# Patient Record
Sex: Female | Born: 1990 | Hispanic: No | Marital: Single | Smoking: Current every day smoker
Health system: Southern US, Community
[De-identification: ages and names within clinical notes are randomized; demographics above are authoritative.]

## PROBLEM LIST (undated history)

## (undated) DIAGNOSIS — F1120 Opioid dependence, uncomplicated: Secondary | ICD-10-CM

## (undated) DIAGNOSIS — J45909 Unspecified asthma, uncomplicated: Secondary | ICD-10-CM

---

## 2017-03-16 ENCOUNTER — Emergency Department (HOSPITAL_COMMUNITY): Payer: Self-pay

## 2017-03-16 ENCOUNTER — Emergency Department (HOSPITAL_COMMUNITY)
Admission: EM | Admit: 2017-03-16 | Discharge: 2017-03-16 | Disposition: A | Discharge status: Home / Self Care | Payer: Self-pay | Attending: Emergency Medicine | Admitting: Emergency Medicine

## 2017-03-16 ENCOUNTER — Other Ambulatory Visit: Payer: Self-pay

## 2017-03-16 ENCOUNTER — Encounter (HOSPITAL_COMMUNITY): Payer: Self-pay | Admitting: Emergency Medicine

## 2017-03-16 DIAGNOSIS — W01198A Fall on same level from slipping, tripping and stumbling with subsequent striking against other object, initial encounter: Secondary | ICD-10-CM | POA: Insufficient documentation

## 2017-03-16 DIAGNOSIS — Y999 Unspecified external cause status: Secondary | ICD-10-CM | POA: Insufficient documentation

## 2017-03-16 DIAGNOSIS — Y9389 Activity, other specified: Secondary | ICD-10-CM | POA: Insufficient documentation

## 2017-03-16 DIAGNOSIS — F17200 Nicotine dependence, unspecified, uncomplicated: Secondary | ICD-10-CM | POA: Insufficient documentation

## 2017-03-16 DIAGNOSIS — J45909 Unspecified asthma, uncomplicated: Secondary | ICD-10-CM | POA: Insufficient documentation

## 2017-03-16 DIAGNOSIS — S5011XA Contusion of right forearm, initial encounter: Secondary | ICD-10-CM | POA: Insufficient documentation

## 2017-03-16 DIAGNOSIS — Y929 Unspecified place or not applicable: Secondary | ICD-10-CM | POA: Insufficient documentation

## 2017-03-16 DIAGNOSIS — S0511XA Contusion of eyeball and orbital tissues, right eye, initial encounter: Secondary | ICD-10-CM | POA: Insufficient documentation

## 2017-03-16 HISTORY — DX: Opioid dependence, uncomplicated: F11.20

## 2017-03-16 HISTORY — DX: Unspecified asthma, uncomplicated: J45.909

## 2017-03-16 NOTE — ED Notes (Signed)
VISUAL ACUITY NOT POSSIBLE-Pt does not have glasses

## 2017-03-16 NOTE — ED Triage Notes (Signed)
Pt has bruising and abrasion over r/eye, bruising around r/eye. Pt stated she was drinking last Sunday,  tripped and fell striking her head on the pool table.Denies assault Stated that the bruising under her r/eye showed up 2 days after injury. Pt stated that she applied ice and took 2 aspirin two days after injury.

## 2017-03-16 NOTE — Discharge Instructions (Signed)
Take tylenol and ibuprofen as needed for discomfort. Return as needed for any problems.

## 2017-03-16 NOTE — ED Provider Notes (Signed)
[facility]-EMERGENCY DEPT Provider Note   CSN: [id number] Arrival date & time: 03/16/17  1156     History   Chief Complaint Chief Complaint  Patient presents with  . Head Injury    HPI Lorraine Garcia is a 27 y.o. female who presents to the ED with facial swelling and bruising s/p injury one week ago. Patient reports that she was drinking alcohol last Sunday and tripped and hit her head on a pool table. Patient c/o bruising under the right eye that she reports appeared 2 days after the injury. Patient took ASA for pain. Patient reports no pain until you press on the area.   The history is provided by the patient. No language interpreter was used.  Head Injury   The incident occurred more than 1 week ago. She came to the ER via walk-in. The injury mechanism was a fall. There was no loss of consciousness. There was no blood loss. The pain is at a severity of 0/10. Pertinent negatives include no blurred vision, no vomiting, no disorientation, no weakness and no memory loss. She has tried aspirin for the symptoms. The treatment provided moderate relief.    Past Medical History:  Diagnosis Date  . Asthma   . Opiate addiction (HCC)     There are no active problems to display for this patient.   History reviewed. No pertinent surgical history.  OB History    No data available       Home Medications    Prior to Admission medications   Not on File    Family History History reviewed. No pertinent family history.  Social History Social History   Tobacco Use  . Smoking status: Current Every Day Smoker    Packs/day: 0.50  Substance Use Topics  . Alcohol use: Yes    Comment: occ  . Drug use: No    Comment: completed tx for opiate addiction     Allergies   Patient has no known allergies.   Review of Systems Review of Systems  Constitutional: Negative for fever.  HENT: Positive for facial swelling. Negative for dental problem.   Eyes: Negative  for blurred vision and visual disturbance.  Gastrointestinal: Negative for nausea and vomiting.  Musculoskeletal: Negative for neck pain.  Skin: Positive for color change.  Neurological: Negative for syncope, weakness and headaches.  Psychiatric/Behavioral: Negative for confusion and memory loss.     Physical Exam Updated Vital Signs BP 127/82 (BP Location: Left Arm)   Pulse (!) 110   Temp 97.9 F (36.6 C) (Oral)   Resp 16   Wt 52.2 kg (115 lb)   LMP 02/02/2017   SpO2 100%   Physical Exam  Constitutional: She is oriented to person, place, and time. She appears well-developed and well-nourished. No distress.  HENT:  Head: Head is with raccoon's eyes (right) and with contusion.    Right Ear: Tympanic membrane normal.  Left Ear: Tympanic membrane normal.  Nose: Nose normal.  Mouth/Throat: Oropharynx is clear and moist. Normal dentition.  Hematoma noted to the right forehead, ecchymosis of the right orbit.  Eyes: Conjunctivae and EOM are normal. Pupils are equal, round, and reactive to light.  Neck: Normal range of motion. Neck supple.  Cardiovascular: Normal rate.  Pulmonary/Chest: Effort normal.  Musculoskeletal: Normal range of motion.  Grips are equal  Neurological: She is alert and oriented to person, place, and time. She has normal strength. No cranial nerve deficit or sensory deficit. She displays a negative Romberg  sign. Gait normal.  Stands on one foot without difficulty.  Skin: Skin is warm and dry.  Psychiatric: She has a normal mood and affect.  Nursing note and vitals reviewed.    ED Treatments / Results  Labs (all labs ordered are listed, but only abnormal results are displayed) Labs Reviewed - No data to display   Radiology Ct Orbits Wo Contrast  Result Date: 03/16/2017 CLINICAL DATA:  Larey SeatFell 1 week ago and hit her right eye on a pool table, bruising around her eye, knot just above her eye painful to touch EXAM: CT ORBITS WITHOUT CONTRAST TECHNIQUE:  Multidetector CT images were obtained using the standard protocol without intravenous contrast. COMPARISON:  None. FINDINGS: Orbits: Osseous structures about the orbits are intact and normally aligned bilaterally. Both orbital globes appear intact and are symmetric in position and configuration. No retro-orbital fluid or edema. Visualized sinuses: Clear Soft tissues: Focal soft tissue edema/hematoma within the superficial soft tissues overlying the lower right frontal bone. No underlying fracture. Limited intracranial: No significant or unexpected finding. IMPRESSION: 1. Focal soft tissue edema/hematoma within the superficial soft tissues overlying the lower right frontal bone. No underlying fracture. No foreign body seen. 2. Osseous structures about the orbits are intact and normally aligned bilaterally. 3. Both orbital globes appear intact and are symmetric in position and configuration. No retro-orbital fluid or edema. Electronically Signed   By: Bary RichardStan  Maynard M.D.   On: 03/16/2017 13:03    Procedures Procedures (including critical care time)  Medications Ordered in ED Medications - No data to display   Initial Impression / Assessment and Plan / ED Course  I have reviewed the triage vital signs and the nursing notes. 27 y.o. female here with right orbit swelling and bruising and forehead hematoma stable for d/c without neuro deficits and no acute findings on CT scan. Return precautions discussed with the patient.   Final Clinical Impressions(s) / ED Diagnoses   Final diagnoses:  Traumatic hematoma of right forearm, initial encounter  Orbital contusion, right, initial encounter    ED Discharge Orders    None       Kerrie Buffaloeese, Hope AbandaM, NP 03/16/17 1315    Arby BarrettePfeiffer, Marcy, MD 03/21/17 0710

## 2017-03-16 NOTE — ED Notes (Signed)
Bed: WTR5 Expected date:  Expected time:  Means of arrival:  Comments: 

## 2019-03-14 IMAGING — CT CT ORBITS W/O CM
3 series · 14 of 47 positions shown, 16 images · non-contrast
Comparison: None.

CLINICAL DATA: Fell 1 week ago and hit her right eye on a pool
table, bruising around her eye, knot just above her eye painful to
touch

EXAM:
CT ORBITS WITHOUT CONTRAST
TECHNIQUE: Multidetector CT images were obtained using the standard protocol
without intravenous contrast.

[Series 4: orbits 2.0 h30s st · axial · 0.28mm/px · z∈[+1202,+1288]mm · 8 of 51 slices shown, 10 images]
[im 4/51  brain]
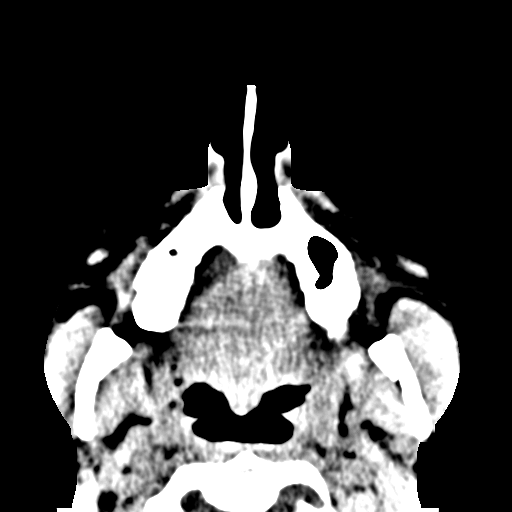
[im 4/51  bone]
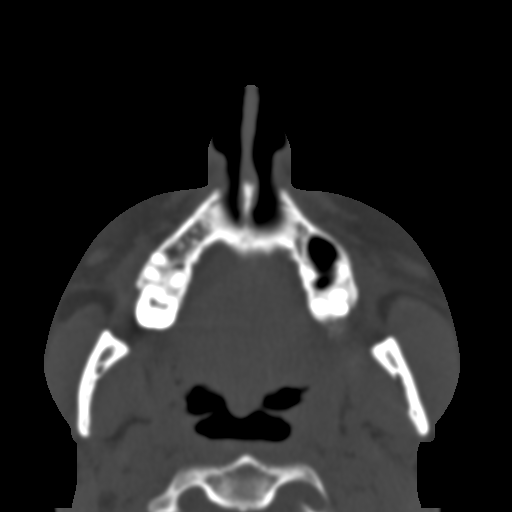
[im 11/51  bone]
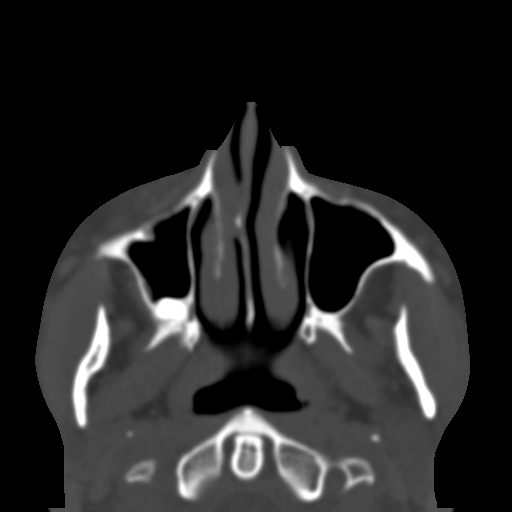
[im 16/51  bone]
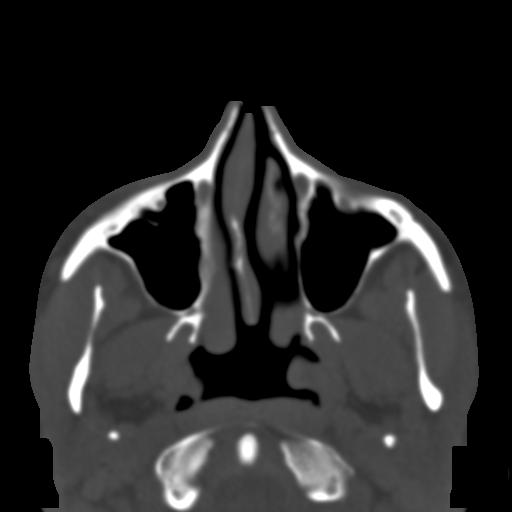
[im 23/51  bone]
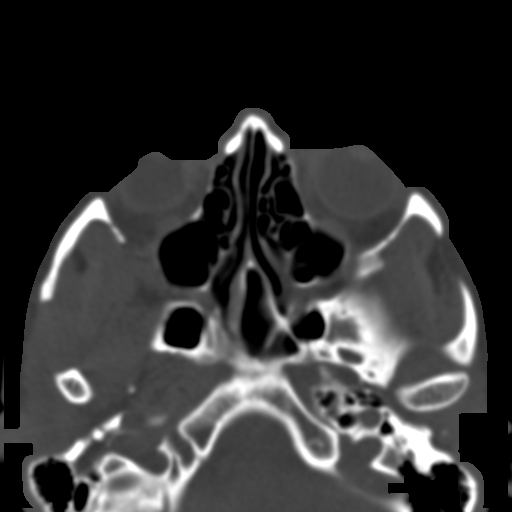
[im 28/51  brain]
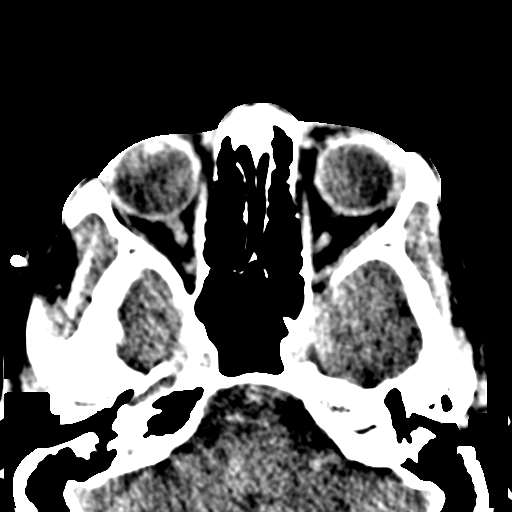
[im 28/51  bone]
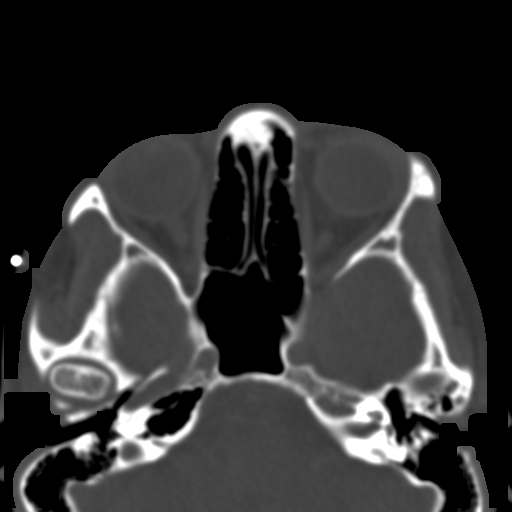
[im 35/51  bone]
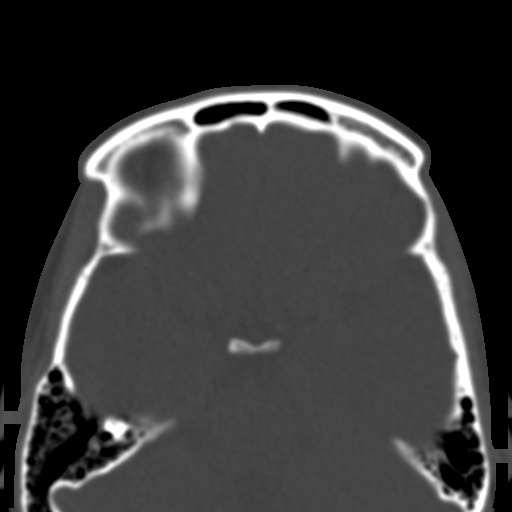
[im 40/51  bone]
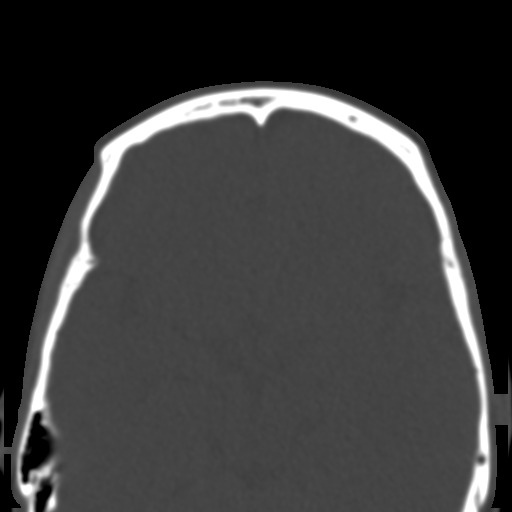
[im 47/51  bone]
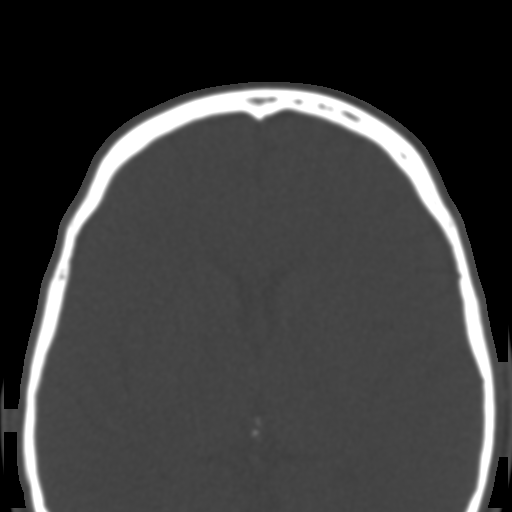

[Series 8: orbits st coronal · coronal · 0.21mm/px · 3 of 77 slices shown]
[im 26/77  bone]
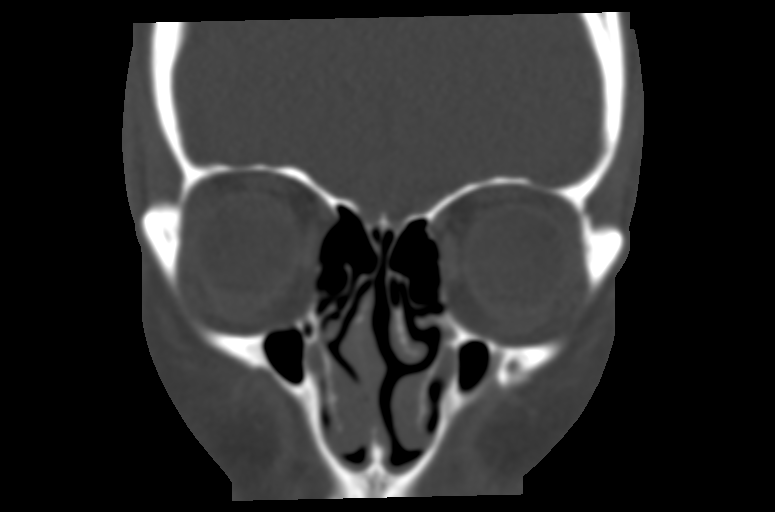
[im 34/77  bone]
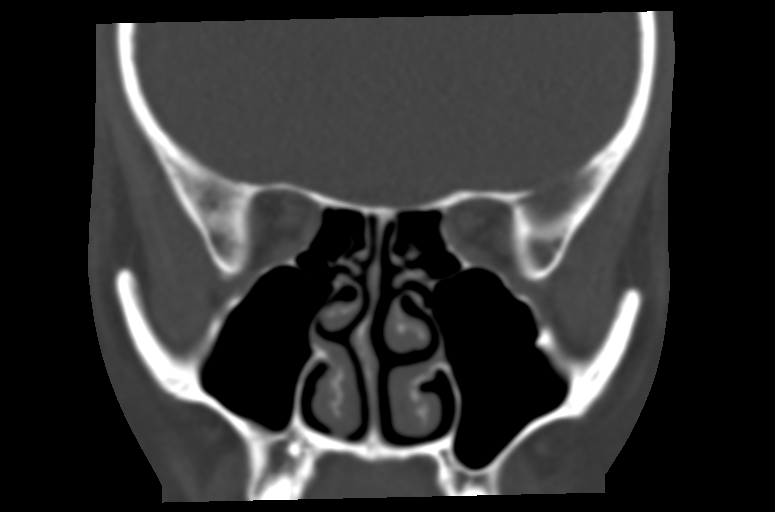
[im 43/77  bone]
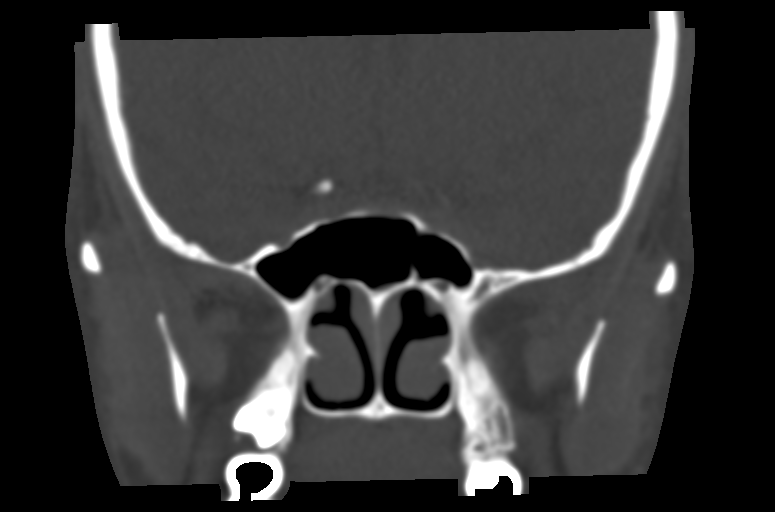

[Series 10: orbits st sagittal · sagittal · 0.20mm/px · 3 of 75 slices shown]
[im 25/75  bone]
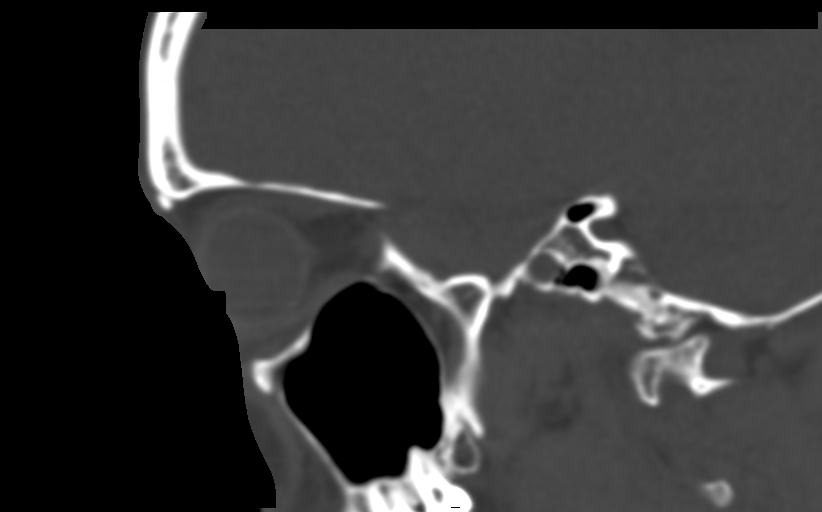
[im 38/75  bone]
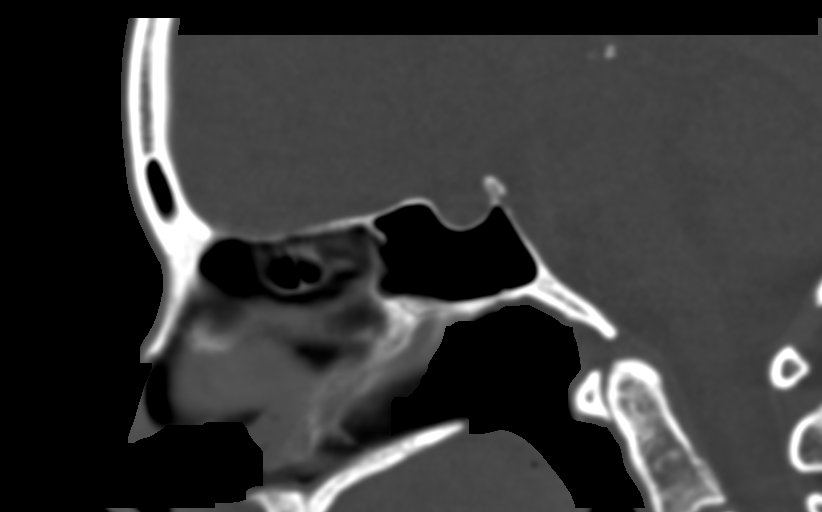
[im 50/75  bone]
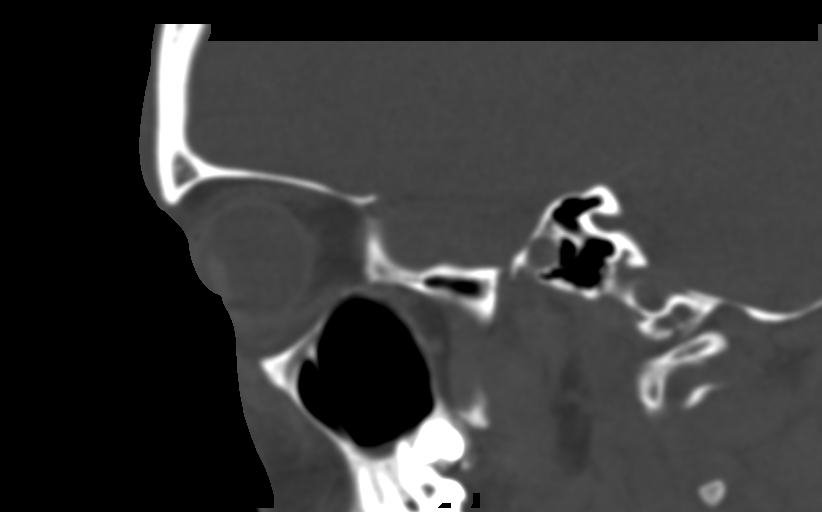

[14 of 47 positions shown; findings below may reference images not displayed]

FINDINGS: Orbits: Osseous structures about the orbits are intact and normally
aligned bilaterally. Both orbital globes appear intact and are
symmetric in position and configuration. No retro-orbital fluid or
edema.

Visualized sinuses: Clear

Soft tissues: Focal soft tissue edema/hematoma within the
superficial soft tissues overlying the lower right frontal bone. No
underlying fracture.

Limited intracranial: No significant or unexpected finding.
IMPRESSION: 1. Focal soft tissue edema/hematoma within the superficial soft
tissues overlying the lower right frontal bone. No underlying
fracture. No foreign body seen.
2. Osseous structures about the orbits are intact and normally
aligned bilaterally.
3. Both orbital globes appear intact and are symmetric in position
and configuration. No retro-orbital fluid or edema.

## 2021-05-02 ENCOUNTER — Encounter: Payer: Self-pay | Admitting: Gastroenterology

## 2021-05-16 ENCOUNTER — Emergency Department
Admission: EM | Admit: 2021-05-16 | Discharge: 2021-05-17 | Disposition: A | Discharge status: Rehabilitation Facility | Payer: Medicaid (Managed Care) | Source: Ambulatory Visit | Attending: Emergency Medicine | Admitting: Emergency Medicine

## 2021-05-16 ENCOUNTER — Other Ambulatory Visit: Payer: Self-pay

## 2021-05-16 DIAGNOSIS — R55 Syncope and collapse: Secondary | ICD-10-CM | POA: Insufficient documentation

## 2021-05-16 DIAGNOSIS — Z789 Other specified health status: Secondary | ICD-10-CM

## 2021-05-16 NOTE — ED Triage Notes (Signed)
From [facility], staff was taking her VS and she got dizzy and passed out. Struck head. No injury noted to head.

## 2021-05-17 ENCOUNTER — Encounter: Payer: Self-pay | Admitting: Emergency Medicine

## 2021-05-17 DIAGNOSIS — Z789 Other specified health status: Secondary | ICD-10-CM

## 2021-05-17 LAB — EKG 12-LEAD
P: 41 deg
PR: 130 ms
QRS: 39 deg
QRSD: 82 ms
QT: 390 ms
QTc: 429 ms
Rate: 72 {beats}/min
T: 34 deg

## 2021-05-17 NOTE — ED Provider Notes (Signed)
History     Chief Complaint   Patient presents with   . Syncope     Anne Boyer is a 31 y.o. female with no significant past medical history in our system and denies any medical history herself presents from [facility] due to dizziness and brief episode of syncope.  She was in the process of getting her vital signs checked when she got dizzy followed rapidly by syncope.  Denies recollection of the syncopal part of the episode.  States that she feels fine at this time.  Has been sleeping throughout her time here, but arouses immediately and is conversational.  Denies substance use just prior to the episode.  According to the triage note she hit her head, although she denies headache and has no evidence of trauma to the head.  She denies any preceding chest discomfort or shortness of breath before the episode and does not have any at this time.  She has never had any heart history such as dysrhythmias or MI.        Medical/Surgical/Family History     History reviewed. No pertinent past medical history.     There is no problem list on file for this patient.           History reviewed. No pertinent surgical history.  No family history on file.          Living Situation     Questions Responses    Patient lives with     Homeless     Caregiver for other family member     External Services     Employment     Domestic Violence Risk                 Review of Systems   Review of Systems   Constitutional: Negative for activity change, appetite change, chills, diaphoresis, fatigue and fever.   HENT: Negative for congestion and rhinorrhea.    Respiratory: Negative for chest tightness and shortness of breath.    Cardiovascular: Negative for chest pain.   Gastrointestinal: Negative for abdominal pain and nausea.   Genitourinary: Negative for decreased urine volume, difficulty urinating, dysuria and frequency.   Musculoskeletal: Negative for arthralgias.   Skin: Negative for color change, pallor and wound.   Allergic/Immunologic:  Negative for immunocompromised state.   Neurological: Positive for dizziness and syncope. Negative for weakness, light-headedness, numbness and headaches.   Hematological: Does not bruise/bleed easily.   Psychiatric/Behavioral: Negative for agitation and confusion.       Physical Exam     Triage Vitals  Triage Start: Start, (05/16/21 2231)  First Recorded BP: 98/59, Resp: 18, Temp: 36.7 C (98.1 F), Temp src: Oral Oxygen Therapy SpO2: 97 %, Oximetry Source: Rt Hand, O2 Device: None (Room air), Heart Rate: 74, (05/16/21 2233) Heart Rate (via Pulse Ox): 74, (05/16/21 2233).  First Pain Reported  0-10 Scale: 0, (05/16/21 2233)       Physical Exam  Vitals and nursing note reviewed.   Constitutional:       General: She is not in acute distress.     Appearance: Normal appearance. She is normal weight. She is not ill-appearing, toxic-appearing or diaphoretic.   HENT:      Head: Normocephalic and atraumatic.      Nose: Nose normal.      Mouth/Throat:      Mouth: Mucous membranes are moist.   Eyes:      Extraocular Movements: Extraocular movements intact.  Conjunctiva/sclera: Conjunctivae normal.      Pupils: Pupils are equal, round, and reactive to light.   Cardiovascular:      Rate and Rhythm: Normal rate and regular rhythm.      Pulses: Normal pulses.      Heart sounds: Normal heart sounds.   Pulmonary:      Effort: Pulmonary effort is normal.      Breath sounds: Normal breath sounds.   Abdominal:      General: Abdomen is flat.      Palpations: Abdomen is soft.   Musculoskeletal:         General: Normal range of motion.      Cervical back: Normal range of motion and neck supple. No rigidity or tenderness.   Skin:     General: Skin is warm and dry.      Coloration: Skin is not pale.      Findings: No bruising.   Neurological:      Mental Status: She is alert and oriented to person, place, and time.      Sensory: No sensory deficit.      Motor: No weakness.   Psychiatric:         Mood and Affect: Mood normal.          Thought Content: Thought content normal.         Medical Decision Making       Patient seen by me on arrival date of 05/16/2021 at at time of arrival  10:23 PM.  Initial face to face evaluation time noted above may be discrepant due to patient acuity and delay in documentation.    Assessment:  31 y.o., female comes to the ED with after having a syncopal episode while having her vital signs checked stated that the local rehab facility.  Denies severe headache, nausea, decreased appetite, severe fatigue, confusion, neck pain, numbness, or weakness.  Appears quite comfortable sleeping and arouses immediately to voice and appropriately interacts.  Vital signs well within normal limits.  EKG without evidence of ischemia or dysrhythmia.  No preceding chest symptoms and has no cardiovascular history.    Differential Diagnosis includes:  - Vasovagal syncope -most likely based on story  - Orthostatic syncope  - Dysrhythmia  - Effect of illicit drug -patient denies preceding use    Plan:   - Orthostatics  - EKG     Addendum: Patient continues to appear quite well after multiple hours of observation.  Orthostatics performed with no significant change.  Discharging patient with return precautions and follow-up instructions back to the [facility].            Thayer Dallas, MD             Victoriano Lain, MD  05/21/21 1620

## 2021-05-17 NOTE — Discharge Instructions (Addendum)
You were seen here today due to passing out briefly when you are getting your vital signs taken.  Your vitals here are normal and your EKG shows no evidence of abnormal rhythm or problems with your heart.  You are safe to go home.    Seek care immediately if:     You suddenly have double vision, difficulty speaking, numbness, and cannot move your arms or legs.    You have chest pain and trouble breathing.    You vomit blood or material that looks like coffee grounds.    You see blood in your bowel movement.    Contact your healthcare provider if:   You have new or worsening symptoms.    You have another syncope episode.    You have a headache, fast heartbeat, or feel too dizzy to stand up.    You have questions or concerns about your condition or care.

## 2021-05-21 ENCOUNTER — Encounter: Payer: Self-pay | Admitting: Emergency Medicine

## 2021-06-03 ENCOUNTER — Encounter: Payer: Self-pay | Admitting: Emergency Medicine

## 2021-06-03 ENCOUNTER — Encounter: Payer: Self-pay | Admitting: Gastroenterology

## 2021-06-03 ENCOUNTER — Emergency Department
Admission: EM | Admit: 2021-06-03 | Discharge: 2021-06-03 | Discharge status: Against Medical Advice | Payer: Medicaid (Managed Care) | Source: Ambulatory Visit | Attending: Emergency Medicine | Admitting: Emergency Medicine

## 2021-06-03 ENCOUNTER — Other Ambulatory Visit: Payer: Self-pay

## 2021-06-03 DIAGNOSIS — Z113 Encounter for screening for infections with a predominantly sexual mode of transmission: Secondary | ICD-10-CM | POA: Insufficient documentation

## 2021-06-03 DIAGNOSIS — R451 Restlessness and agitation: Secondary | ICD-10-CM | POA: Insufficient documentation

## 2021-06-03 DIAGNOSIS — Z5320 Procedure and treatment not carried out because of patient's decision for unspecified reasons: Secondary | ICD-10-CM

## 2021-06-03 DIAGNOSIS — Z789 Other specified health status: Secondary | ICD-10-CM

## 2021-06-03 LAB — URINALYSIS REFLEX TO CULTURE
Blood,UA: NEGATIVE
Glucose,UA: NEGATIVE
Ketones, UA: NEGATIVE
Leuk Esterase,UA: NEGATIVE
Nitrite,UA: NEGATIVE
Protein,UA: NEGATIVE
Specific Gravity,UA: 1.01 (ref 1.002–1.030)
pH,UA: 6.5 (ref 5.0–8.0)

## 2021-06-03 LAB — COMPREHENSIVE METABOLIC PANEL
ALT: 13 U/L (ref 0–35)
AST: 17 U/L (ref 0–35)
Albumin: 4.7 g/dL (ref 3.5–5.2)
Alk Phos: 86 U/L (ref 35–105)
Anion Gap: 13 (ref 7–16)
Bilirubin,Total: 0.2 mg/dL (ref 0.0–1.2)
CO2: 26 mmol/L (ref 20–28)
Calcium: 9.8 mg/dL (ref 8.8–10.2)
Chloride: 100 mmol/L (ref 96–108)
Creatinine: 0.72 mg/dL (ref 0.51–0.95)
Glucose: 124 mg/dL — ABNORMAL HIGH (ref 60–99)
Lab: 12 mg/dL (ref 6–20)
Potassium: 4.1 mmol/L (ref 3.3–4.6)
Sodium: 139 mmol/L (ref 133–145)
Total Protein: 7.6 g/dL (ref 6.3–7.7)
eGFR BY CREAT: 114 *

## 2021-06-03 LAB — CBC AND DIFFERENTIAL
Baso # K/uL: 0 10*3/uL (ref 0.0–0.1)
Basophil %: 0.4 %
Eos # K/uL: 0.2 10*3/uL (ref 0.0–0.4)
Eosinophil %: 2.6 %
Hematocrit: 33 % — ABNORMAL LOW (ref 34–45)
Hemoglobin: 11.1 g/dL — ABNORMAL LOW (ref 11.2–15.7)
IMM Granulocytes #: 0 10*3/uL (ref 0.0–0.0)
IMM Granulocytes: 0.4 %
Lymph # K/uL: 2.5 10*3/uL (ref 1.2–3.7)
Lymphocyte %: 29.9 %
MCH: 27 pg (ref 26–32)
MCHC: 34 g/dL (ref 32–36)
MCV: 82 fL (ref 79–95)
Mono # K/uL: 0.6 10*3/uL (ref 0.2–0.9)
Monocyte %: 7.7 %
Neut # K/uL: 4.9 10*3/uL (ref 1.6–6.1)
Nucl RBC # K/uL: 0 10*3/uL (ref 0.0–0.0)
Nucl RBC %: 0 /100 WBC (ref 0.0–0.2)
Platelets: 307 10*3/uL (ref 160–370)
RBC: 4.1 MIL/uL (ref 3.9–5.2)
RDW: 13.8 % (ref 11.7–14.4)
Seg Neut %: 59 %
WBC: 8.2 10*3/uL (ref 4.0–10.0)

## 2021-06-03 LAB — REGIONAL CHEMICAL DEPEN SCRN, UR
Amphetamine,UR: NEGATIVE
Barbiturate,UR: NEGATIVE
Benzodiazepinen,UR: NEGATIVE
Cocaine/Metab,UR: NEGATIVE
Methadone Metab,UR: NEGATIVE
Opiates,UR: NEGATIVE
PCP,UR: NEGATIVE
THC Metabolite,UR: NEGATIVE
Tricyclics,UR: NEGATIVE

## 2021-06-03 LAB — ACETAMINOPHEN LEVEL: Acetaminophen: <5 ug/mL

## 2021-06-03 LAB — T4, FREE: Free T4: 1.2 ng/dL (ref 0.9–1.7)

## 2021-06-03 LAB — ETHANOL: Ethanol: <10 mg/dL (ref 0–9)

## 2021-06-03 LAB — SALICYLATE LEVEL: Salicylate: <3 mg/dL — ABNORMAL LOW (ref 15.0–30.0)

## 2021-06-03 LAB — PREGNANCY, URINE: Preg Test,UR: NEGATIVE

## 2021-06-03 LAB — TSH: TSH: 1.57 u[IU]/mL (ref 0.27–4.20)

## 2021-06-03 NOTE — ED Triage Notes (Deleted)
[facility] notes increased anxiety

## 2021-06-03 NOTE — ED Notes (Signed)
Patient came from [facility], Patient is hanging out in the room and relaxed at this time. Patient was sent from [facility] stating she is not acting right and confused. Patient does seem confused and can not gather her train of thought. Patients vitals are stable and as follows Blood pressure 112/67, pulse 88, temperature 36.8 C (98.2 F), temperature source Tympanic, resp. rate 20, height 1.753 m (5\' 9" ), weight 68 kg (150 lb), SpO2 98 %.    Will continue to monitor.

## 2021-06-03 NOTE — ED Provider Notes (Signed)
History     Chief Complaint   Patient presents with   . Suicidal     Patient sent from [facility], states thoughts of hurting herself.  [facility] notes that she has not been clear and has been getting aggitated     HPI this is a 31 year old female past medical history significant for polysubstance abuse mood disorder and bipolar disorder presenting from [facility] with increased agitation and "acting strange" .  Patient very vague historian with flat affect during encounter, I asked if she was suicidal she said and she denies same. She denies any physical complaints or HI or hallucinations.        Medical/Surgical/Family History     History reviewed. No pertinent past medical history.     There is no problem list on file for this patient.           History reviewed. No pertinent surgical history.  No family history on file.          Living Situation     Questions Responses    Patient lives with     Homeless     Caregiver for other family member     External Services     Employment     Domestic Violence Risk                 Review of Systems   Review of Systems   Unable to perform ROS: Other       Physical Exam     Triage Vitals  Triage Start: Start, (06/03/21 1259)  First Recorded BP: 112/67, Resp: 20, Temp: 36.8 C (98.2 F), Temp src: Tympanic Oxygen Therapy SpO2: 98 %, O2 Device: None (Room air), Heart Rate: 88, (06/03/21 1301)  .  First Pain Reported  0-10 Scale: 0, (06/03/21 1301)       Physical Exam  Vitals and nursing note reviewed.   Constitutional:       General: She is not in acute distress.     Appearance: Normal appearance.   HENT:      Head: Normocephalic and atraumatic.      Mouth/Throat:      Mouth: Mucous membranes are moist.      Pharynx: Oropharynx is clear.   Eyes:      Extraocular Movements: Extraocular movements intact.      Conjunctiva/sclera: Conjunctivae normal.      Pupils: Pupils are equal, round, and reactive to light.   Cardiovascular:      Rate and Rhythm: Normal rate and regular rhythm.      Heart  sounds: Normal heart sounds.   Pulmonary:      Effort: Pulmonary effort is normal.      Breath sounds: Normal breath sounds.   Abdominal:      General: There is no distension.      Palpations: Abdomen is soft.      Tenderness: There is no abdominal tenderness.   Musculoskeletal:         General: Normal range of motion.      Cervical back: Normal range of motion and neck supple.   Skin:     General: Skin is warm and dry.   Neurological:      General: No focal deficit present.      Mental Status: She is alert and oriented to person, place, and time. Mental status is at baseline.      Cranial Nerves: No cranial nerve deficit.   Psychiatric:      Comments:  Flat affect,, no HI/SI or obvious signs of psychosis.         Medical Decision Making             Patient left the emergency department.  No HI SI or obvious signs of psychosis.  Physical or chemical restraints not indicated.    Juline Patch, PA             Juline Patch, [place]  06/03/21 1349

## 2021-06-03 NOTE — ED Triage Notes (Addendum)
Pt sent from [facility] notes that she has been acting "strange" and confused at times.  Increased agitation. [facility] notes increased anxiety        Patient not answering questions appropriately, when asked if she has any medical history, no answer, asked if she has ever had any surgeries and she responded "Si, yes, all of them".

## 2021-06-05 LAB — SYPHILIS SCREEN
Syphilis Screen: POSITIVE
Syphilis Status: REACTIVE

## 2021-06-05 LAB — EKG 12-LEAD
P: 38 deg
PR: 135 ms
QRS: 41 deg
QRSD: 77 ms
QT: 394 ms
QTc: 447 ms
Rate: 77 {beats}/min
T: 36 deg

## 2021-06-05 LAB — RPR: RPR Screen: POSITIVE — AB

## 2021-06-05 LAB — TREPONEMA PALLIDUM: Syphilis Confirm: POSITIVE

## 2021-06-05 LAB — FOLATE: Folate: >14 ng/mL (ref >=4.6)

## 2021-06-05 LAB — RPR, TITER: RPR, Titer: 8

## 2021-06-05 LAB — VITAMIN B12: Vitamin B12: 393 pg/mL (ref 232–1245)
